# Patient Record
Sex: Male | Born: 1970 | Hispanic: Yes | Marital: Married
Health system: Southern US, Community
[De-identification: ages and names within clinical notes are randomized; demographics above are authoritative.]

---

## 2020-09-25 ENCOUNTER — Other Ambulatory Visit: Payer: Self-pay

## 2020-09-25 ENCOUNTER — Emergency Department
Admission: EM | Admit: 2020-09-25 | Discharge: 2020-09-26 | Disposition: A | Discharge status: Home / Self Care | Payer: Self-pay | Attending: Emergency Medicine | Admitting: Emergency Medicine

## 2020-09-25 DIAGNOSIS — L03113 Cellulitis of right upper limb: Secondary | ICD-10-CM | POA: Insufficient documentation

## 2020-09-25 DIAGNOSIS — M795 Residual foreign body in soft tissue: Secondary | ICD-10-CM | POA: Insufficient documentation

## 2020-09-25 MED ORDER — MORPHINE SULFATE (PF) 4 MG/ML IV SOLN
4.0000 mg | Freq: Once | INTRAVENOUS | Status: AC
  Administered 2020-09-26: 4 mg via INTRAVENOUS
  Filled 2020-09-25: qty 1

## 2020-09-25 MED ORDER — TETANUS-DIPHTH-ACELL PERTUSSIS 5-2.5-18.5 LF-MCG/0.5 IM SUSY
0.5000 mL | PREFILLED_SYRINGE | Freq: Once | INTRAMUSCULAR | Status: DC
  Filled 2020-09-25: qty 0.5

## 2020-09-25 MED ORDER — SODIUM CHLORIDE 0.9 % IV SOLN
1.0000 g | Freq: Once | INTRAVENOUS | Status: AC
  Administered 2020-09-26: 1 g via INTRAVENOUS
  Filled 2020-09-25: qty 10

## 2020-09-25 NOTE — ED Triage Notes (Signed)
Pt was working with wood yesterday and hit right hand against piece of wood. Also got splinter in hand that he pulled out. Here for increased pain and swelling to right hand.

## 2020-09-25 NOTE — ED Provider Notes (Signed)
[facility] Emergency Department Provider Note   ____________________________________________   Event Date/Time   First MD Initiated Contact with Patient 09/25/20 2324     (approximate)  I have reviewed the triage vital signs and the nursing notes.   HISTORY  Chief Complaint Hand Pain    HPI Kevin Orr is a 50 y.o. male who presents to the ED from home with a chief complaint of right hand pain and swelling.  Patient was working with wood yesterday when he struck his right hand against a piece of wood.  Removed a splinter from his right hand.  Presents with increased pain and swelling.  Patient is right-hand dominant.  Tetanus unknown.  Denies fever, chills, chest pain, shortness of breath, abdominal pain, nausea, vomiting or dizziness.     Past medical history None  There are no problems to display for this patient.   Prior to Admission medications   Medication Sig Start Date End Date Taking? Authorizing Provider  amoxicillin-clavulanate (AUGMENTIN) 875-125 MG tablet Take 1 tablet by mouth 2 (two) times daily. 09/26/20  Yes Irean Hong, MD  HYDROcodone-acetaminophen (NORCO) 5-325 MG tablet Take 1 tablet by mouth every 6 (six) hours as needed for moderate pain. 09/26/20  Yes Irean Hong, MD    Allergies Patient has no allergy information on record.  No family history on file.  Social History    Review of Systems  Constitutional: No fever/chills Eyes: No visual changes. ENT: No sore throat. Cardiovascular: Denies chest pain. Respiratory: Denies shortness of breath. Gastrointestinal: No abdominal pain.  No nausea, no vomiting.  No diarrhea.  No constipation. Genitourinary: Negative for dysuria. Musculoskeletal: Positive for right hand pain and swelling.  Negative for back pain. Skin: Negative for rash. Neurological: Negative for headaches, focal weakness or numbness.   ____________________________________________   PHYSICAL  EXAM:  VITAL SIGNS: ED Triage Vitals [09/25/20 2317]  Enc Vitals Group     BP (!) 157/98     Pulse Rate 77     Resp 20     Temp 98.5 F (36.9 C)     Temp Source Oral     SpO2 98 %     Weight 175 lb (79.4 kg)     Height 5\' 5"  (1.651 m)     Head Circumference      Peak Flow      Pain Score 5     Pain Loc      Pain Edu?      Excl. in GC?     Constitutional: Alert and oriented. Well appearing and in no acute distress. Eyes: Conjunctivae are normal. PERRL. EOMI. Head: Atraumatic. Nose: No congestion/rhinnorhea. Mouth/Throat: Mucous membranes are moist.   Neck: No stridor.   Cardiovascular: Normal rate, regular rhythm. Grossly normal heart sounds.  Good peripheral circulation. Respiratory: Normal respiratory effort.  No retractions. Lungs CTAB. Gastrointestinal: Soft and nontender. No distention. No abdominal bruits. No CVA tenderness. Musculoskeletal:  Right hand: Mild to moderate swelling to dorsal surface over fourth and fifth metacarpals.  No purulence, no fluctuance.  Area is warm without erythema.  No streaking towards wrist.  No foreign body palpated.  2+ radial pulse.  Brisk, less than 5-second capillary refill. No lower extremity tenderness nor edema.  No joint effusions. Neurologic:  Normal speech and language. No gross focal neurologic deficits are appreciated. No gait instability. Skin:  Skin is warm, dry and intact. No rash noted. Psychiatric: Mood and affect are normal. Speech and behavior are  normal.  ____________________________________________   LABS (all labs ordered are listed, but only abnormal results are displayed)  Labs Reviewed  BASIC METABOLIC PANEL - Abnormal; Notable for the following components:      Result Value   Glucose, Bld 124 (*)    All other components within normal limits  CULTURE, BLOOD (ROUTINE X 2)  CULTURE, BLOOD (ROUTINE X 2)  LACTIC ACID, PLASMA  CBC WITH DIFFERENTIAL/PLATELET    ____________________________________________  EKG  None ____________________________________________  RADIOLOGY I, Myelle Poteat J, personally viewed and evaluated these images (plain radiographs) as part of my medical decision making, as well as reviewing the written report by the radiologist.  ED MD interpretation: Study demonstrates soft tissue swelling; ultrasound demonstrates retained foreign body  Official radiology report(s): DG Hand Complete Right  Result Date: 09/26/2020 CLINICAL DATA:  Soft tissue swelling.  Recent splinter removal. EXAM: RIGHT HAND - COMPLETE 3+ VIEW COMPARISON:  None. FINDINGS: There is no evidence of fracture or dislocation. There is no evidence of arthropathy or other focal bone abnormality. Mild soft tissue swelling. IMPRESSION: Soft tissue swelling without retained radiopaque foreign body. Electronically Signed   By: Deatra Robinson M.D.   On: 09/26/2020 00:54   [place] RT UPPER EXTREM LTD SOFT TISSUE NON VASCULAR  Result Date: 09/26/2020 CLINICAL DATA:  Pain and swelling in right hand, splinter pole yesterday. EXAM: ULTRASOUND right hand UPPER EXTREMITY LIMITED TECHNIQUE: Ultrasound examination of the upper extremity soft tissues was performed in the area of clinical concern. COMPARISON:  None. FINDINGS: Linear echogenic focus at the base of the pinky finger on the right hand in the patient's palpable area of concern. IMPRESSION: Linear echogenic focus at the base of the pinky finger on the right hand compatible with retained splinter. Electronically Signed   By: Maudry Mayhew MD   On: 09/26/2020 03:16    ____________________________________________   PROCEDURES  Procedure(s) performed (including Critical Care):  Procedures   ____________________________________________   INITIAL IMPRESSION / ASSESSMENT AND PLAN / ED COURSE  As part of my medical decision making, I reviewed the following data within the electronic MEDICAL RECORD NUMBER History obtained from  family, Nursing notes reviewed and incorporated, Labs reviewed, Old chart reviewed, Radiograph reviewed, Notes from prior ED visits and [place] Controlled Substance Database     50 year old male presenting with right hand pain and swelling.  Differential diagnosis includes but is not limited to cellulitis, abscess, retained foreign body, gout, etc.  Patient is confident that he removed the entire splinter yesterday.  Will obtain lab work, start with x-ray.  Administer IV morphine for pain, IV Ancef for antibiotic coverage.  Will reassess.  Clinical Course as of 09/26/20 0333  Tue Sep 26, 2020  0126 Laboratory results unremarkable.  X-rays unremarkable aside from soft tissue swelling.  Will obtain soft tissue ultrasound to evaluate wooden foreign body. [JS]  3419 Discussed with Dr. Odis Luster from orthopedics ultrasound results.  Patient will follow-up at [facility] today for hand evaluation and splinter removal.  Will place on Augmentin and pain medicine.  Strict return precautions given.  Patient and wife verbalized understanding agree with plan of care. [JS]    Clinical Course User Index [JS] Irean Hong, MD     ____________________________________________   FINAL CLINICAL IMPRESSION(S) / ED DIAGNOSES  Final diagnoses:  Cellulitis of right upper extremity  Foreign body (FB) in soft tissue     ED Discharge Orders         Ordered    amoxicillin-clavulanate (AUGMENTIN) 875-125 MG  tablet  2 times daily        09/26/20 0141    HYDROcodone-acetaminophen (NORCO) 5-325 MG tablet  Every 6 hours PRN        09/26/20 0141          *Please note:  Kevin Orr was evaluated in Emergency Department on 09/26/2020 for the symptoms described in the history of present illness. He was evaluated in the context of the global COVID-19 pandemic, which necessitated consideration that the patient might be at risk for infection with the SARS-CoV-2 virus that causes COVID-19. Institutional protocols and  algorithms that pertain to the evaluation of patients at risk for COVID-19 are in a state of rapid change based on information released by regulatory bodies including the CDC and federal and state organizations. These policies and algorithms were followed during the patient's care in the ED.  Some ED evaluations and interventions may be delayed as a result of limited staffing during and the pandemic.*   Note:  This document was prepared using Dragon voice recognition software and may include unintentional dictation errors.   Irean Hong, MD 09/26/20 [phone]

## 2020-09-26 ENCOUNTER — Emergency Department: Payer: Self-pay

## 2020-09-26 LAB — CBC WITH DIFFERENTIAL/PLATELET
Abs Immature Granulocytes: 0.03 10*3/uL (ref 0.00–0.07)
Basophils Absolute: 0.1 10*3/uL (ref 0.0–0.1)
Basophils Relative: 1 %
Eosinophils Absolute: 0.2 10*3/uL (ref 0.0–0.5)
Eosinophils Relative: 2 %
HCT: 42 % (ref 39.0–52.0)
Hemoglobin: 13.9 g/dL (ref 13.0–17.0)
Immature Granulocytes: 0 %
Lymphocytes Relative: 30 %
Lymphs Abs: 3.1 10*3/uL (ref 0.7–4.0)
MCH: 27.7 pg (ref 26.0–34.0)
MCHC: 33.1 g/dL (ref 30.0–36.0)
MCV: 83.7 fL (ref 80.0–100.0)
Monocytes Absolute: 0.9 10*3/uL (ref 0.1–1.0)
Monocytes Relative: 9 %
Neutro Abs: 5.9 10*3/uL (ref 1.7–7.7)
Neutrophils Relative %: 58 %
Platelets: 241 10*3/uL (ref 150–400)
RBC: 5.02 MIL/uL (ref 4.22–5.81)
RDW: 12.6 % (ref 11.5–15.5)
WBC: 10.1 10*3/uL (ref 4.0–10.5)
nRBC: 0 % (ref 0.0–0.2)

## 2020-09-26 LAB — BASIC METABOLIC PANEL
Anion gap: 8 (ref 5–15)
BUN: 14 mg/dL (ref 6–20)
CO2: 26 mmol/L (ref 22–32)
Calcium: 9 mg/dL (ref 8.9–10.3)
Chloride: 103 mmol/L (ref 98–111)
Creatinine, Ser: 0.83 mg/dL (ref 0.61–1.24)
GFR, Estimated: >60 mL/min (ref >60)
Glucose, Bld: 124 mg/dL — ABNORMAL HIGH (ref 70–99)
Potassium: 3.8 mmol/L (ref 3.5–5.1)
Sodium: 137 mmol/L (ref 135–145)

## 2020-09-26 LAB — LACTIC ACID, PLASMA: Lactic Acid, Venous: 1.6 mmol/L (ref 0.5–1.9)

## 2020-09-26 MED ORDER — HYDROCODONE-ACETAMINOPHEN 5-325 MG PO TABS: 1.0000 | ORAL_TABLET | Freq: Four times a day (QID) | ORAL | 0 refills | Status: AC | PRN

## 2020-09-26 MED ORDER — AMOXICILLIN-POT CLAVULANATE 875-125 MG PO TABS: 1.0000 | ORAL_TABLET | Freq: Two times a day (BID) | ORAL | 0 refills | Status: AC

## 2020-09-26 NOTE — Discharge Instructions (Addendum)
1.  Take antibiotic as prescribed (Augmentin 875mg  twice daily x7 days). 2.  You may take Norco as needed for pain. 3.  Return to the ER for worsening symptoms, persistent vomiting, difficulty breathing, fever or other concerns.

## 2020-10-01 LAB — CULTURE, BLOOD (ROUTINE X 2)
Culture: NO GROWTH
Culture: NO GROWTH

## 2022-11-09 IMAGING — US US EXTREM UP *R* LTD
1 series · 9 of 9 positions shown · non-contrast
Comparison: None.

CLINICAL DATA: Pain and swelling in right hand, splinter pole
yesterday.

EXAM:
ULTRASOUND right hand UPPER EXTREMITY LIMITED
TECHNIQUE: Ultrasound examination of the upper extremity soft tissues was
performed in the area of clinical concern.

[Series 1: us soft tissue right upper extremity limited (non- · 9 of 9 slices shown]
[im 1/9]
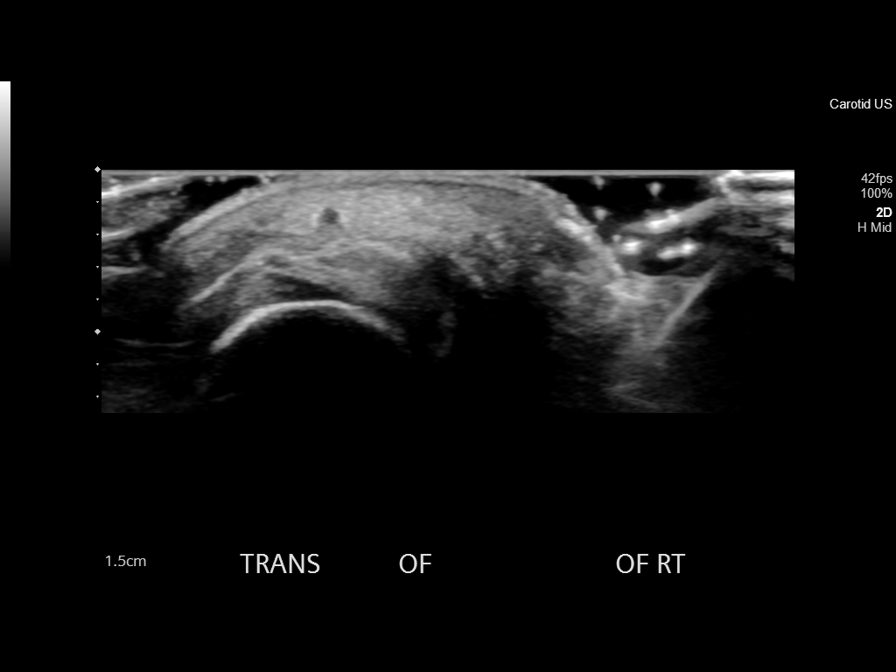
[im 2/9]
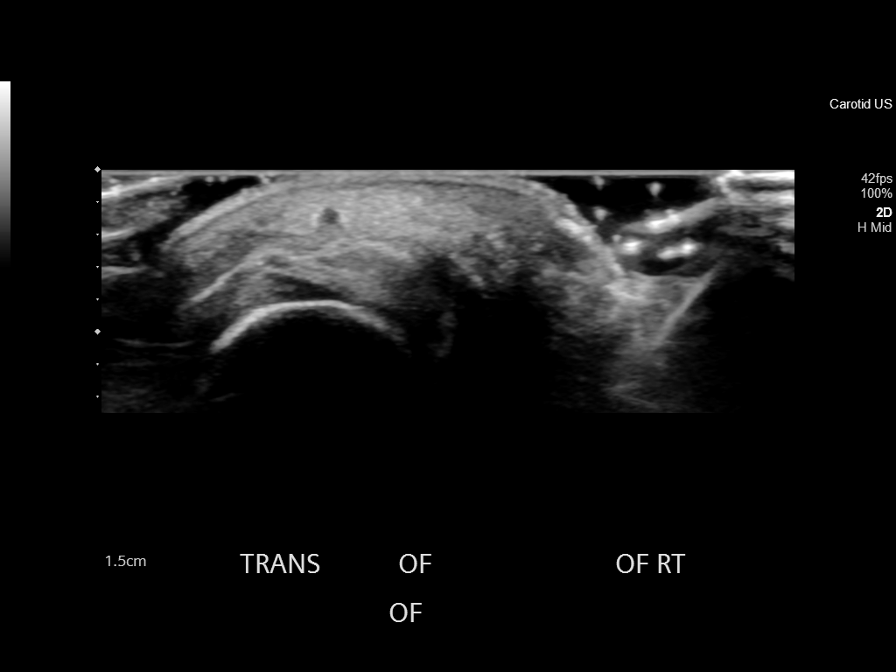
[im 3/9]
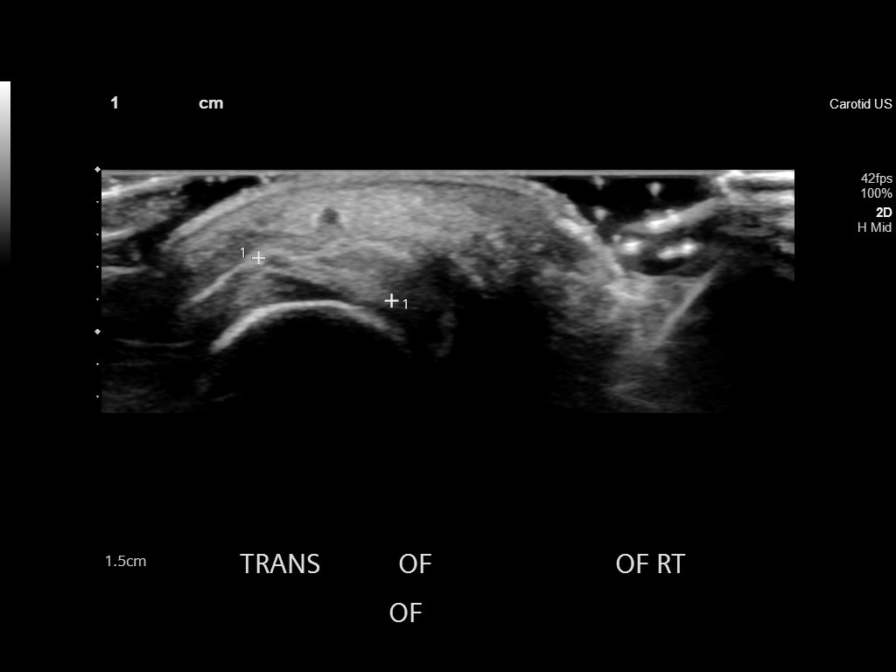
[im 4/9]
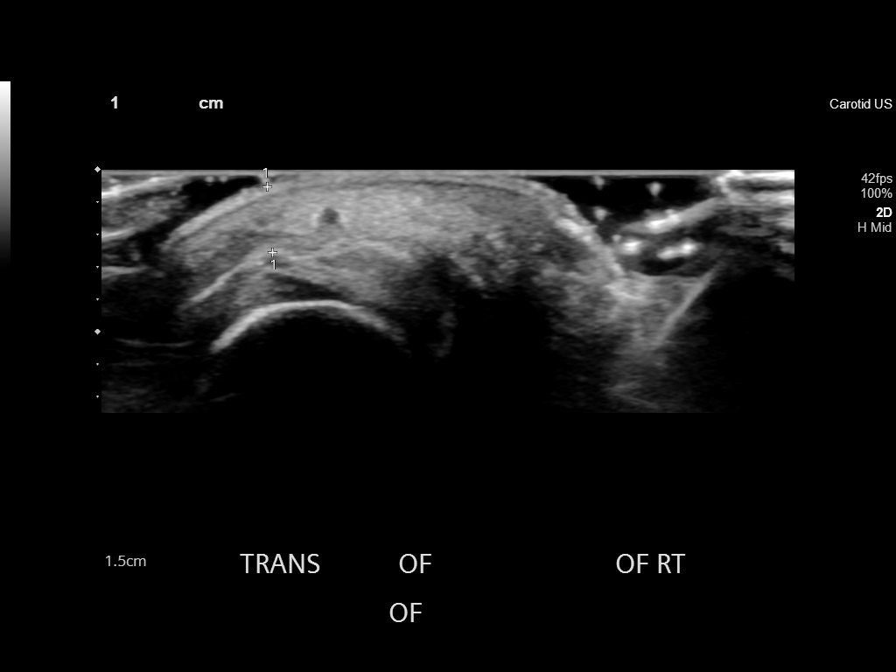
[im 5/9]
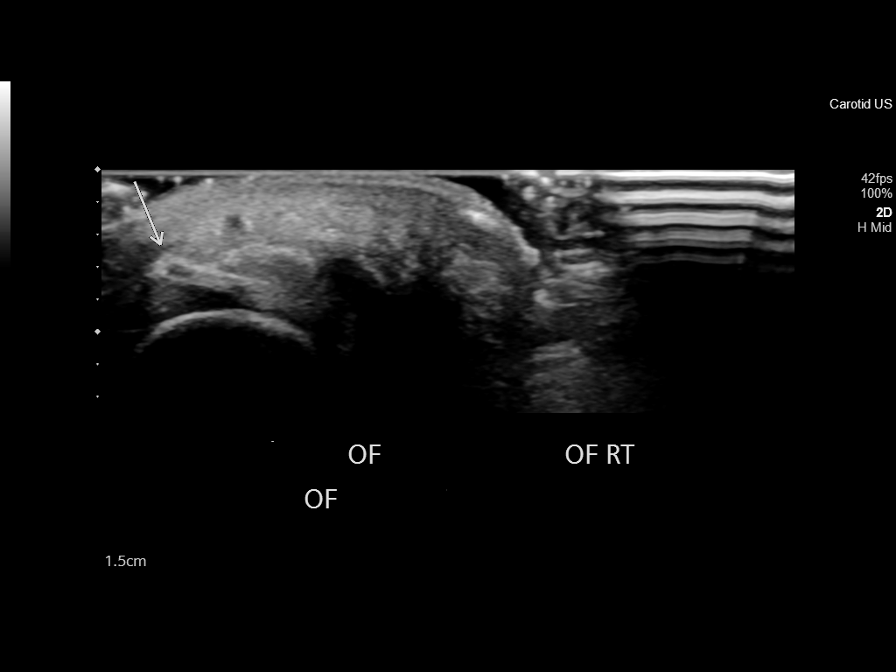
[im 6/9]
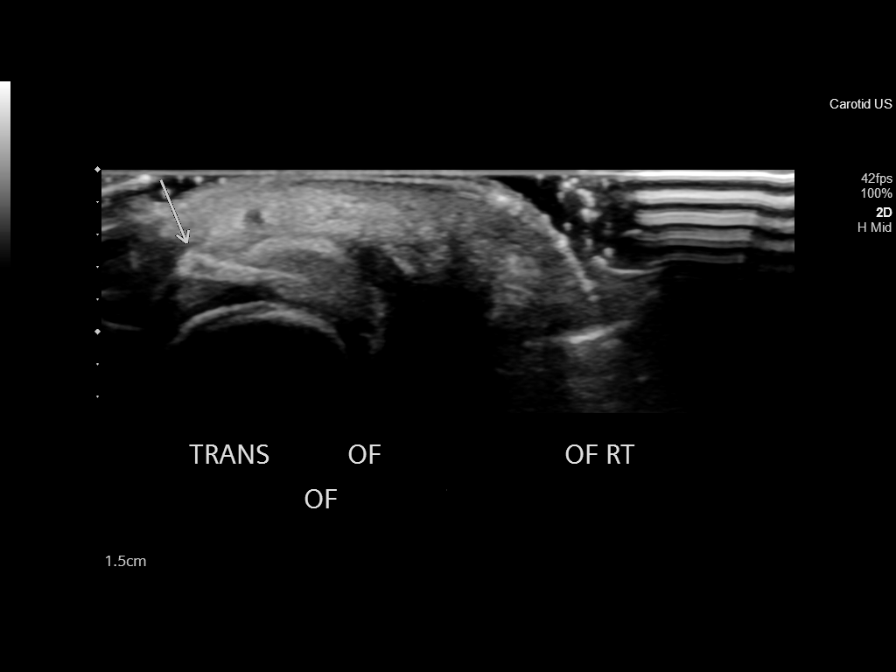
[im 7/9]
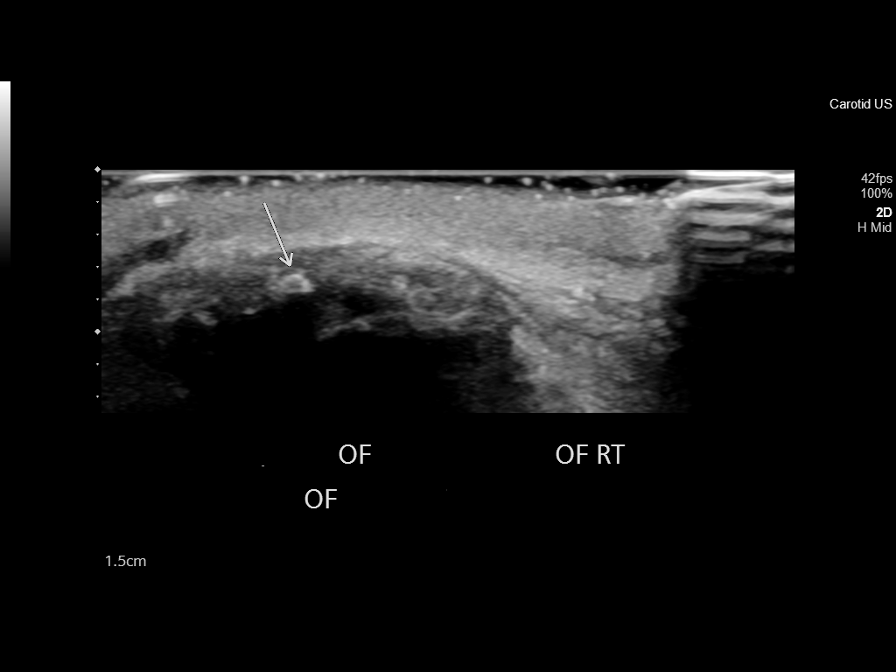
[im 8/9]
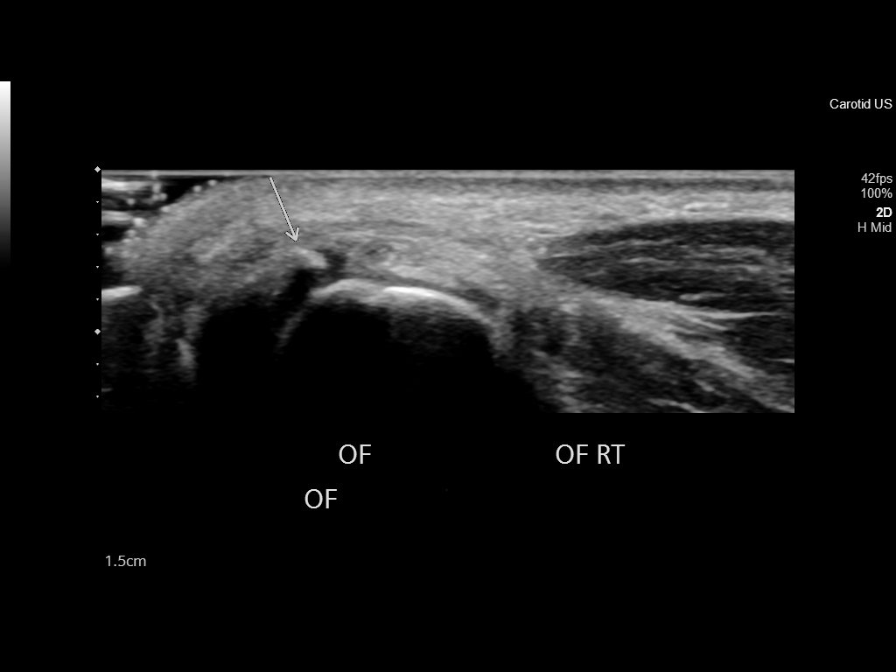
[im 9/9]
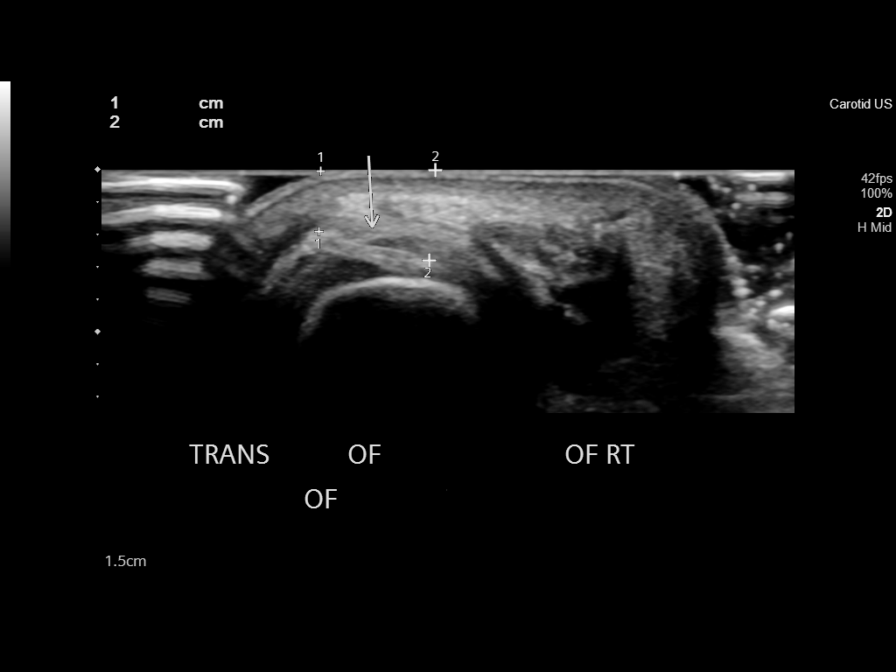

[9 of 9 positions shown; findings below may reference images not displayed]

FINDINGS: Linear echogenic focus at the base of the pinky finger on the right
hand in the patient's palpable area of concern.
IMPRESSION: Linear echogenic focus at the base of the pinky finger on the right
hand compatible with retained splinter.

## 2023-10-29 ENCOUNTER — Ambulatory Visit: Payer: Self-pay | Admitting: Dermatology

## 2023-10-29 ENCOUNTER — Encounter: Payer: Self-pay | Admitting: Dermatology

## 2023-10-29 DIAGNOSIS — D1721 Benign lipomatous neoplasm of skin and subcutaneous tissue of right arm: Secondary | ICD-10-CM | POA: Diagnosis not present

## 2023-10-29 DIAGNOSIS — D171 Benign lipomatous neoplasm of skin and subcutaneous tissue of trunk: Secondary | ICD-10-CM | POA: Diagnosis not present

## 2023-10-29 DIAGNOSIS — Z7189 Other specified counseling: Secondary | ICD-10-CM

## 2023-10-29 DIAGNOSIS — E882 Lipomatosis, not elsewhere classified: Secondary | ICD-10-CM

## 2023-10-29 NOTE — Progress Notes (Unsigned)
   New Patient Visit   Subjective  Kevin Orr is a 53 y.o. male who presents for the following: Cyst? R arm, abdomen, x 10 yrs, painful with pressure, the one on R arm may be growing The patient has spots, moles and lesions to be evaluated, some may be new or changing and the patient may have concern these could be cancer.  New patient referral from Dr. Alyn Babe Revelo.  Patient accompanied by wife.  The following portions of the chart were reviewed this encounter and updated as appropriate: medications, allergies, medical history  Review of Systems:  No other skin or systemic complaints except as noted in HPI or Assessment and Plan.  Objective  Well appearing patient in no apparent distress; mood and affect are within normal limits.   A focused examination was performed of the following areas: Arms, trunk  Relevant exam findings are noted in the Assessment and Plan.    Assessment & Plan   Lipoma  Exam: Subcutaneous rubbery nodule(s) Location:  R medial tricep 2.0 x 2.5cm, painful prn L epigastric 2.0 x 0.1cm painful Others on abdomen  Benign-appearing. Exam most consistent with a lipoma. Discussed that a lipoma is a benign fatty growth that can grow over time and sometimes get irritated. Recommend observation if it is not bothersome or changing. Discussed option of ILK injections or surgical excision to remove it if it is growing, symptomatic, or other changes noted. Please call for new or changing lesions so they can be evaluated.  Discussed if rapidly growing or become more painful recommend removal   Return for may schedule for 2 surgeries for Lipomas.  I, Rollie Clipper, RMA, am acting as scribe for Celine Collard, MD .   Documentation: I have reviewed the above documentation for accuracy and completeness, and I agree with the above.  Celine Collard, MD

## 2023-10-29 NOTE — Patient Instructions (Addendum)
 Pre-Operative Instructions  You are scheduled for a surgical procedure at [facility]. We recommend you read the following instructions. If you have any questions or concerns, please call the office at [phone].  Shower and wash the entire body with soap and water the day of your surgery paying special attention to cleansing at and around the planned surgery site.  Avoid aspirin or aspirin containing products at least fourteen (14) days prior to your surgical procedure and for at least one week (7 Days) after your surgical procedure. If you take aspirin on a regular basis for heart disease or history of stroke or for any other reason, we may recommend you continue taking aspirin but please notify us  if you take this on a regular basis. Aspirin can cause more bleeding to occur during surgery as well as prolonged bleeding and bruising after surgery.   Avoid other nonsteroidal pain medications at least one week prior to surgery and at least one week prior to your surgery. These include medications such as Ibuprofen (Motrin, Advil and Nuprin), Naprosyn, Voltaren, Relafen, etc. If medications are used for therapeutic reasons, please inform us  as they can cause increased bleeding or prolonged bleeding during and bruising after surgical procedures.   Please advise us  if you are taking any blood thinner medications such as Coumadin or Dipyridamole or Plavix or similar medications. These cause increased bleeding and prolonged bleeding during procedures and bruising after surgical procedures. We may have to consider discontinuing these medications briefly prior to and shortly after your surgery if safe to do so.   Please inform us  of all medications you are currently taking. All medications that are taken regularly should be taken the day of surgery as you always do. Nevertheless, we need to be informed of what medications you are taking prior to surgery to know whether they will affect the  procedure or cause any complications.   Please inform us  of any medication allergies. Also inform us  of whether you have allergies to Latex or rubber products or whether you have had any adverse reaction to Lidocaine or Epinephrine.  Please inform us  of any prosthetic or artificial body parts such as artificial heart valve, joint replacements, etc., or similar condition that might require preoperative antibiotics.   We recommend avoidance of alcohol at least two weeks prior to surgery and continued avoidance for at least two weeks after surgery.   We recommend discontinuation of tobacco smoking at least two weeks prior to surgery and continued abstinence for at least two weeks after surgery.  Do not plan strenuous exercise, strenuous work or strenuous lifting for approximately four weeks after your surgery.   We request if you are unable to make your scheduled surgical appointment, please call us  at least a week in advance or as soon as you are aware of a problem so that we can cancel or reschedule the appointment.   You MAY TAKE TYLENOL  (acetaminophen ) for pain as it is not a blood thinner.   PLEASE PLAN TO BE IN TOWN FOR TWO WEEKS FOLLOWING SURGERY, THIS IS IMPORTANT SO YOU CAN BE CHECKED FOR DRESSING CHANGES, SUTURE REMOVAL AND TO MONITOR FOR POSSIBLE COMPLICATIONS.    Lipoma  Un lipoma es un tumor no canceroso (benigno) formado por clulas de grasa. Es un tipo muy frecuente de [employer] tejidos blandos. Por lo general, los lipomas se encuentran debajo de la piel (subcutneos). Pueden aparecer en cualquier tejido del cuerpo que contenga grasa. Las [employer]  los lipomas aparecen con mayor frecuencia incluyen la espalda, los brazos, los hombros, las nalgas y los muslos. Los lipomas crecen lentamente y, en general, son indoloros. La mayora de los lipomas no causan problemas y no requieren TEFL teacher. Cules son las causas? Se desconoce la causa de esta afeccin. Qu  incrementa el riesgo? Es ms probable que sufra esta afeccin si: Tiene entre 40 y 26 aos de edad. Tiene antecedentes familiares de lipomas. Cules son los signos o sntomas? Por lo general, el lipoma aparece como una pequea protuberancia redonda debajo de la piel. En la [employer], el bulto tiene las siguientes caractersticas: Se siente suave o elstico. No causa dolor ni otros sntomas. Sin embargo, si el lipoma se encuentra en un rea en la que hace presin [employer] nervios, puede causar dolor u otros sntomas. Cmo se diagnostica? Por lo general, el lipoma puede diagnosticarse con un examen fsico. Tambin pueden hacerle estudios para confirmar el diagnstico y Sales promotion account executive otras afecciones. Las pruebas pueden incluir las siguientes: Pruebas de diagnstico por imgenes, como una exploracin por tomografa computarizada (TC) o una resonancia magntica (RM). Extraccin de una muestra de tejido para analizar con un microscopio (biopsia). Cmo se trata? El tratamiento de esta afeccin depende del tamao del lipoma y si causa algn sntoma. Los lipomas pequeos que no causan problemas no requieren tratamiento. Si un lipoma se agranda o causa problemas, puede realizarse [place] para extirpar el lipoma. Los lipomas tambin pueden extirparse para mejorar el aspecto. En la [employer], [place] procedimiento se realiza despus de aplicar un medicamento que adormece el rea (anestesia local). Pueden realizarse una liposuccin para reducir el tamao del lipoma antes de que se lo extraigan quirrgicamente, o bien se la puede realizar para extirpar el lipoma. Los lipomas se extirpan con este mtodo para limitar el tamao de la incisin y las cicatrices. Se introduce un tubo de liposuccin a travs de una pequea incisin en el lipoma y el contenido del lipoma se extrae a travs del tubo mediante succin. Siga estas indicaciones en su casa: Controle el lipoma para detectar  cambios. Concurra a todas las visitas de seguimiento. Esto es importante. Dnde obtener ms informacin OrthoInfo: orthoinfo.aaos.org Comunquese con un mdico si: El lipoma se agranda o se endurece. El lipoma comienza a causarle dolor, se enrojece o se hincha cada vez ms. Estos podran ser signos de infeccin o de una afeccin ms grave. Solicite ayuda de inmediato si: Siente hormigueo o adormecimiento en el rea cerca del lipoma. Esto podra indicar que el lipoma es lo que causa dao nervioso. Resumen Un lipoma es un tumor no canceroso formado por clulas de grasa. La mayora de los lipomas no causan problemas y no requieren TEFL teacher. Si un lipoma se agranda o causa problemas, puede realizarse [place] para extirpar el lipoma. Comunquese con un mdico si el lipoma se agranda o se endurece, o si empieza a dolor, se pone de color rojo o se hincha cada vez ms. Estos podran ser signos de infeccin o de una afeccin ms grave. Esta informacin no tiene Theme park manager el consejo del mdico. Asegrese de hacerle al mdico cualquier pregunta que tenga. Document Revised: 06/13/2021 Document Reviewed: 06/13/2021 Elsevier Patient Education  2024 Elsevier Inc.Lipoma   Lipoma  A lipoma is a noncancerous (benign) tumor that is made up of fat cells. This is a very common type of soft-tissue growth. Lipomas are usually found under the skin (subcutaneous). They may occur in any tissue  of the body that contains fat. Common areas for lipomas to appear include the back, arms, shoulders, buttocks, and thighs. Lipomas grow slowly, and they are usually painless. Most lipomas do not cause problems and do not require treatment. What are the causes? The cause of this condition is not known. What increases the risk? You are more likely to develop this condition if: You are 42-21 years old. You have a family history of lipomas. What are the signs or symptoms? A lipoma usually appears as a small,  round bump under the skin. In most cases, the lump will: Feel soft or rubbery. Not cause pain or other symptoms. However, if a lipoma is located in an area where it pushes on nerves, it can become painful or cause other symptoms. How is this diagnosed? A lipoma can usually be diagnosed with a physical exam. You may also have tests to confirm the diagnosis and to rule out other conditions. Tests may include: Imaging tests, such as a CT scan or an MRI. Removal of a tissue sample to be looked at under a microscope (biopsy). How is this treated? Treatment for this condition depends on the size of the lipoma and whether it is causing any symptoms. For small lipomas that are not causing problems, no treatment is needed. If a lipoma is bigger or it causes problems, surgery may be done to remove the lipoma. Lipomas can also be removed to improve appearance. Most often, the procedure is done after applying a medicine that numbs the area (local anesthetic). Liposuction may be done to reduce the size of the lipoma before it is removed through surgery, or it may be done to remove the lipoma. Lipomas are removed with this method to limit incision size and scarring. A liposuction tube is inserted through a small incision into the lipoma, and the contents of the lipoma are removed through the tube with suction. Follow these instructions at home: Watch your lipoma for any changes. Keep all follow-up visits. This is important. Where to find more information OrthoInfo: orthoinfo.aaos.org Contact a health care provider if: Your lipoma becomes larger or hard. Your lipoma becomes painful, red, or increasingly swollen. These could be signs of infection or a more serious condition. Get help right away if: You develop tingling or numbness in an area near the lipoma. This could indicate that the lipoma is causing nerve damage. Summary A lipoma is a noncancerous tumor that is made up of fat cells. Most lipomas do not  cause problems and do not require treatment. If a lipoma is bigger or it causes problems, surgery may be done to remove the lipoma. Contact a health care provider if your lipoma becomes larger or hard, or if it becomes painful, red, or increasingly swollen. These could be signs of infection or a more serious condition. This information is not intended to replace advice given to you by your health care provider. Make sure you discuss any questions you have with your health care provider. Document Revised: 05/18/2021 Document Reviewed: 05/18/2021 Elsevier Patient Education  2024 Elsevier Inc.Due to recent changes in healthcare laws, you may see results of your pathology and/or laboratory studies on MyChart before the doctors have had a chance to review them. We understand that in some cases there may be results that are confusing or concerning to you. Please understand that not all results are received at the same time and often the doctors may need to interpret multiple results in order to provide you with the best  plan of care or course of treatment. Therefore, we ask that you please give us  2 business days to thoroughly review all your results before contacting the office for clarification. Should we see a critical lab result, you will be contacted sooner.   If You Need Anything After Your Visit  If you have any questions or concerns for your doctor, please call our main line at [phone] and press option 4 to reach your doctor's medical assistant. If no one answers, please leave a voicemail as directed and we will return your call as soon as possible. Messages left after 4 pm will be answered the following business day.   You may also send us  a message via MyChart. We typically respond to MyChart messages within 1-2 business days.  For prescription refills, please ask your pharmacy to contact our office. Our fax number is [phone].  If you have an urgent issue when the clinic is closed that  cannot wait until the next business day, you can page your doctor at the number below.    Please note that while we do our best to be available for urgent issues outside of office hours, we are not available 24/7.   If you have an urgent issue and are unable to reach us , you may choose to seek medical care at your doctor's office, retail clinic, urgent care center, or emergency room.  If you have a medical emergency, please immediately call 911 or go to the emergency department.  Pager Numbers  - Dr. Bary Likes: [phone]  - Dr. Annette Barters: [phone]  - Dr. Felipe Horton: [phone]   In the event of inclement weather, please call our main line at [phone] for an update on the status of any delays or closures.  Dermatology Medication Tips: Please keep the boxes that topical medications come in in order to help keep track of the instructions about where and how to use these. Pharmacies typically print the medication instructions only on the boxes and not directly on the medication tubes.   If your medication is too expensive, please contact our office at [phone] option 4 or send us  a message through MyChart.   We are unable to tell what your co-pay for medications will be in advance as this is different depending on your insurance coverage. However, we may be able to find a substitute medication at lower cost or fill out paperwork to get insurance to cover a needed medication.   If a prior authorization is required to get your medication covered by your insurance company, please allow us  1-2 business days to complete this process.  Drug prices often vary depending on where the prescription is filled and some pharmacies may offer cheaper prices.  The website www.goodrx.com contains coupons for medications through different pharmacies. The prices here do not account for what the cost may be with help from insurance (it may be cheaper with your insurance), but the website can give you  the price if you did not use any insurance.  - You can print the associated coupon and take it with your prescription to the pharmacy.  - You may also stop by our office during regular business hours and pick up a GoodRx coupon card.  - If you need your prescription sent electronically to a different pharmacy, notify our office through [facility] or by phone at [phone] option 4.     Si Usted Necesita Algo Despus de Su Visita  Tambin puede enviarnos un mensaje a travs de Clinical cytogeneticist.  Por lo general respondemos a los mensajes de MyChart en el transcurso de 1 a 2 das hbiles.  Para renovar recetas, por favor pida a su farmacia que se ponga en contacto con nuestra oficina. Franz Jacks de fax es [place] [phone].  Si tiene un asunto urgente cuando la clnica est cerrada y que no puede esperar hasta el siguiente da hbil, puede llamar/localizar a su doctor(a) al nmero que aparece a continuacin.   Por favor, tenga en cuenta que aunque hacemos todo lo posible para estar disponibles para asuntos urgentes fuera del horario de [place], no estamos disponibles las 24 horas del da, los 7 [address]  [address] de la [place].   Si tiene un problema urgente y no puede comunicarse con nosotros, puede optar por buscar atencin mdica  en el consultorio de su doctor(a), en una clnica privada, en un centro de atencin urgente o en una sala de emergencias.  Si tiene Engineer, drilling, por favor llame inmediatamente al 911 o vaya a la sala de emergencias.  Nmeros de bper  - Dr. Bary Likes: [phone]  - Dra. Annette Barters: [phone]  - Dr. Felipe Horton: [phone]   En caso de inclemencias del tiempo, por favor llame a Lajuan Pila principal al [phone] para una actualizacin sobre el [place] de cualquier retraso o cierre.  Consejos para la medicacin en dermatologa: Por favor, guarde las cajas en las que vienen los medicamentos de uso tpico para ayudarle a seguir las instrucciones sobre dnde y  cmo usarlos. Las farmacias generalmente imprimen las instrucciones del medicamento slo en las cajas y no directamente en los tubos del [place].   Si su medicamento es muy caro, por favor, pngase en contacto con Bettyjane Brunet llamando al [phone] y presione la opcin 4 o envenos un mensaje a travs de Clinical cytogeneticist.   No podemos decirle cul ser su copago por los medicamentos por adelantado ya que esto es diferente dependiendo de la cobertura de su seguro. Sin embargo, es posible que podamos encontrar un medicamento sustituto a Audiological scientist un formulario para que el seguro cubra el medicamento que se considera necesario.   Si se requiere una autorizacin previa para que su compaa de seguros [place] su medicamento, por favor permtanos de 1 a 2 das hbiles para completar este proceso.  Los precios de los medicamentos varan con frecuencia dependiendo del Environmental consultant de dnde se surte la receta y alguna farmacias pueden ofrecer precios ms baratos.  El sitio web www.goodrx.com tiene cupones para medicamentos de Health and safety inspector. Los precios aqu no tienen en cuenta lo que podra costar con la ayuda del seguro (puede ser ms barato con su seguro), pero el sitio web puede darle el precio si no utiliz Tourist information centre manager.  - Puede imprimir el cupn correspondiente y llevarlo con su receta a la farmacia.  - Tambin puede pasar por nuestra oficina durante el horario de atencin regular y Education officer, museum una tarjeta de cupones de GoodRx.  - Si necesita que su receta se enve electrnicamente a una farmacia diferente, informe a nuestra oficina a travs de MyChart de [place] o por telfono llamando al [phone] y presione la opcin 4.

## 2023-10-30 ENCOUNTER — Encounter: Payer: Self-pay | Admitting: Dermatology

## 2024-02-16 ENCOUNTER — Ambulatory Visit: Admitting: Dermatology

## 2024-02-16 DIAGNOSIS — D1721 Benign lipomatous neoplasm of skin and subcutaneous tissue of right arm: Secondary | ICD-10-CM | POA: Diagnosis not present

## 2024-02-16 DIAGNOSIS — D485 Neoplasm of uncertain behavior of skin: Secondary | ICD-10-CM

## 2024-02-16 NOTE — Progress Notes (Signed)
   Follow-Up Visit   Subjective  Kevin Orr is a 53 y.o. male who presents for the following: Lipoma of the right medial upper arm, excision today.   This patient is accompanied in the office by his spouse.   The following portions of the chart were reviewed this encounter and updated as appropriate: medications, allergies, medical history  Review of Systems:  No other skin or systemic complaints except as noted in HPI or Assessment and Plan.  Objective  Well appearing patient in no apparent distress; mood and affect are within normal limits.  A focused examination was performed of the following areas: Right arm  Relevant physical exam findings are noted in the Assessment and Plan.  right medial upper arm Rubbery nodule, 2.5 x 2.0 cm  Assessment & Plan   NEOPLASM OF UNCERTAIN BEHAVIOR OF SKIN right medial upper arm Skin excision  Lesion length (cm):  2.5 Lesion width (cm):  2 Margin per side (cm):  0.1 Total excision diameter (cm):  2.7 Informed consent: discussed and consent obtained   Timeout: patient name, date of birth, surgical site, and procedure verified   Procedure prep:  Patient was prepped and draped in usual sterile fashion Prep type:  Povidone-iodine Anesthesia: the lesion was anesthetized in a standard fashion   Anesthetic:  1% lidocaine w/ epinephrine 1-100,000 buffered w/ 8.4% NaHCO3 (Total 15 cc) Instrument used comment:  #15c blade Hemostasis achieved with: pressure   Outcome: patient tolerated procedure well with no complications    Skin repair Complexity:  Intermediate Final length (cm):  1.5 Informed consent: discussed and consent obtained   Reason for type of repair: reduce tension to allow closure, reduce the risk of dehiscence, infection, and necrosis, reduce subcutaneous dead space and avoid a hematoma, preserve normal anatomical and functional relationships and enhance both functionality and cosmetic results   Undermining: edges could be  approximated without difficulty and edges undermined   Subcutaneous layers (deep stitches):  Suture size:  4-0 Suture type: Vicryl (polyglactin 910)   Stitches:  Buried vertical mattress Fine/surface layer approximation (top stitches):  Suture size:  4-0 Suture type: nylon   Stitches: simple interrupted   Suture removal (days):  7 Hemostasis achieved with: suture Outcome: patient tolerated procedure well with no complications   Post-procedure details: sterile dressing applied and wound care instructions given   Dressing type: pressure dressing (mupirocin)    Specimen 1 - Surgical pathology Differential Diagnosis: Lipoma vs other Check Margins: No   Return in about 1 week (around 02/23/2024) for suture removal..  I, Andrea Kerns, CMA, am acting as scribe for Rexene Rattler, MD .   Documentation: I have reviewed the above documentation for accuracy and completeness, and I agree with the above.  Rexene Rattler, MD

## 2024-02-16 NOTE — Patient Instructions (Signed)
Wound Care Instructions for After Surgery  On the day following your surgery, you should begin doing daily dressing changes until your sutures are removed:  Remove the bandage.  Cleanse the wound gently with soap and water.   Make sure you then dry the skin surrounding the wound completely or the tape will not stick to the skin. Do not use cotton balls on the wound.  After the wound is clean and dry, apply the ointment (either prescription antibiotic prescribed by your doctor or plain Vaseline if nothing was prescribed) gently with a Q-tip.  If you are using a bandaid to cover:  Apply a bandaid large enough to cover the entire wound.  If you do not have a bandaid large enough to cover the wound OR if you are sensitive to bandaid adhesive:  Cut a non-stick pad (such as Telfa) to fit the size of the wound.   Cover the wound with the non-stick pad.  If the wound is draining, you may want to add a small amount of gauze on top of the non-stick pad for a little added compression to the area.  Use tape to seal the area completely.  For the next 1-2 weeks:  Be sure to keep the wound moist with ointment 24/7 to ensure best healing. If you are unable to cover the wound with a bandage to hold the ointment in place, you may need to reapply the ointment several times a day.  Do not bend over or lift heavy items to reduce the chance of elevated blood pressure to the wound.  Do not participate in particularly strenuous activities.  Below is a list of dressing supplies you might need.   Cotton-tipped applicators - Q-tips  Gauze pads (2x2 and/or 4x4) - All-Purpose Sponges  New and clean tube of petroleum jelly (Vaseline) OR prescription antibiotic ointment if prescribed  Either a bandaid large enough to cover the entire wound OR non-stick dressing material (Telfa) and Tape (Paper or Hypafix)  FOR ADULT SURGERY PATIENTS: If you need something for pain relief, you may take 1 extra strength  Tylenol (acetaminophen) and 2 ibuprofen (200 mg) together every 4 hours as needed. (Do not take these medications if you are allergic to them or if you know you cannot take them for any other reason). Typically you may only need pain medication for 1-3 days.   Comments on the Post-Operative Period Slight swelling and redness often appear around the wound. This is normal and will disappear within several days following the surgery. The healing wound will drain a brownish-red-yellow discharge during healing. This is a normal phase of wound healing. As the wound begins to heal, the drainage may increase in amount. Again, this drainage is normal. Notify us if the drainage becomes persistently bloody, excessively swollen, or intensely painful or develops a foul odor or red streaks.  The healing wound will also typically be itchy. This is normal. If you have severe or persistent pain, Notify us if the discomfort is severe or persistent. Avoid alcoholic beverages when taking pain medicine.  In Case of Wound Hemorrhage A wound hemorrhage is when the bandage suddenly becomes soaked with bright red blood and flows profusely. If this happens, sit down or lie down with your head elevated. If the wound has a dressing on it, do not remove the dressing. Apply pressure to the existing gauze. If the wound is not covered, use a gauze pad to apply pressure and continue applying the pressure for 20 minutes without   peeking. DO NOT COVER THE WOUND WITH A LARGE TOWEL OR WASH CLOTH. Release your hand from the wound site but do not remove the dressing. If the bleeding has stopped, gently clean around the wound. Leave the dressing in place for 24 hours if possible. This wait time allows the blood vessels to close off so that you do not spark a new round of bleeding by disrupting the newly clotted blood vessels with an immediate dressing change. If the bleeding does not subside, continue to hold pressure for 40 minutes. If bleeding  continues, page your physician, contact an After Hours clinic or go to the Emergency Room. ?  

## 2024-02-17 ENCOUNTER — Telehealth: Payer: Self-pay

## 2024-02-17 NOTE — Telephone Encounter (Signed)
Talked to patient and he is doing fine from surgery yesterday.  

## 2024-02-19 LAB — SURGICAL PATHOLOGY

## 2024-02-21 ENCOUNTER — Ambulatory Visit: Payer: Self-pay | Admitting: Dermatology

## 2024-02-23 ENCOUNTER — Ambulatory Visit (INDEPENDENT_AMBULATORY_CARE_PROVIDER_SITE_OTHER)

## 2024-02-23 DIAGNOSIS — Z48817 Encounter for surgical aftercare following surgery on the skin and subcutaneous tissue: Secondary | ICD-10-CM

## 2024-02-23 NOTE — Telephone Encounter (Signed)
-----   Message from Rexene Rattler sent at 02/21/2024 12:35 PM EDT ----- 1. Skin (M), right medial upper arm :       ANGIOLIPOMA   benign - please call patient ----- Message ----- From: Interface, [facility] Sent: 02/19/2024   5:55 PM EDT To: Rexene Rattler, MD

## 2024-02-23 NOTE — Patient Instructions (Signed)

## 2024-02-23 NOTE — Progress Notes (Signed)
   Follow-Up Visit   Subjective  Kevin Orr is a 53 y.o. male who presents for the following: Suture removal right medial upper arm   Pathology showed  ANGIOLIPOMA   The following portions of the chart were reviewed this encounter and updated as appropriate: medications, allergies, medical history  Review of Systems:  No other skin or systemic complaints except as noted in HPI or Assessment and Plan.  Objective  Well appearing patient in no apparent distress; mood and affect are within normal limits.  Areas Examined: right medial upper arm   Relevant physical exam findings are noted in the Assessment and Plan.    Assessment & Plan    Encounter for Removal of Sutures - Incision site is clean, dry and intact. - Wound cleansed, sutures removed, wound cleansed and steri strips applied.  - Discussed pathology results showing ANGIOLIPOMA  - Patient advised to keep steri-strips dry until they fall off. - Scars remodel for a full year. - Once steri-strips fall off, patient can apply over-the-counter silicone scar cream once to twice a day to help with scar remodeling if desired. - Patient advised to call with any concerns or if they notice any new or changing lesions.  Return if symptoms worsen or fail to improve.  Fay Kirks

## 2024-02-23 NOTE — Telephone Encounter (Signed)
 Left pt msg to call for bx results/sh

## 2024-03-01 ENCOUNTER — Encounter: Admitting: Dermatology
